# Patient Record
Sex: Male | Born: 1975 | Race: White | Hispanic: No | Marital: Married | Smoking: Never smoker
Health system: Southern US, Community
[De-identification: ages and names within clinical notes are randomized; demographics above are authoritative.]

## PROBLEM LIST (undated history)

## (undated) DIAGNOSIS — M12271 Villonodular synovitis (pigmented), right ankle and foot: Secondary | ICD-10-CM

## (undated) DIAGNOSIS — F909 Attention-deficit hyperactivity disorder, unspecified type: Secondary | ICD-10-CM

## (undated) HISTORY — PX: SHOULDER ARTHROSCOPY: SHX128

## (undated) HISTORY — PX: REVISION AMPUTATION OF FINGER: SHX2346

## (undated) HISTORY — PX: WISDOM TOOTH EXTRACTION: SHX21

## (undated) HISTORY — PX: FINGER TENDON REPAIR: SHX1640

## (undated) HISTORY — PX: TOE SURGERY: SHX1073

## (undated) HISTORY — DX: Attention-deficit hyperactivity disorder, unspecified type: F90.9

---

## 2008-09-03 ENCOUNTER — Emergency Department (HOSPITAL_COMMUNITY): Admission: EM | Admit: 2008-09-03 | Discharge: 2008-09-04 | Payer: Self-pay | Admitting: Emergency Medicine

## 2011-04-28 DIAGNOSIS — D2360 Other benign neoplasm of skin of unspecified upper limb, including shoulder: Secondary | ICD-10-CM | POA: Insufficient documentation

## 2015-11-04 ENCOUNTER — Encounter (HOSPITAL_COMMUNITY): Payer: Self-pay

## 2015-11-04 ENCOUNTER — Emergency Department (HOSPITAL_COMMUNITY): Payer: Managed Care, Other (non HMO)

## 2015-11-04 ENCOUNTER — Emergency Department (HOSPITAL_COMMUNITY)
Admission: EM | Admit: 2015-11-04 | Discharge: 2015-11-04 | Disposition: A | Discharge status: Home / Self Care | Payer: Managed Care, Other (non HMO) | Attending: Emergency Medicine | Admitting: Emergency Medicine

## 2015-11-04 DIAGNOSIS — Z87828 Personal history of other (healed) physical injury and trauma: Secondary | ICD-10-CM | POA: Diagnosis not present

## 2015-11-04 DIAGNOSIS — M5410 Radiculopathy, site unspecified: Secondary | ICD-10-CM | POA: Insufficient documentation

## 2015-11-04 DIAGNOSIS — Z880 Allergy status to penicillin: Secondary | ICD-10-CM | POA: Insufficient documentation

## 2015-11-04 DIAGNOSIS — M792 Neuralgia and neuritis, unspecified: Secondary | ICD-10-CM

## 2015-11-04 DIAGNOSIS — R079 Chest pain, unspecified: Secondary | ICD-10-CM | POA: Diagnosis present

## 2015-11-04 LAB — CBC
HCT: 43 % (ref 39.0–52.0)
Hemoglobin: 15.2 g/dL (ref 13.0–17.0)
MCH: 32.9 pg (ref 26.0–34.0)
MCHC: 35.3 g/dL (ref 30.0–36.0)
MCV: 93.1 fL (ref 78.0–100.0)
PLATELETS: 183 10*3/uL (ref 150–400)
RBC: 4.62 MIL/uL (ref 4.22–5.81)
RDW: 12.4 % (ref 11.5–15.5)
WBC: 4.1 10*3/uL (ref 4.0–10.5)

## 2015-11-04 LAB — BASIC METABOLIC PANEL
ANION GAP: 8 (ref 5–15)
BUN: 12 mg/dL (ref 6–20)
CALCIUM: 9.5 mg/dL (ref 8.9–10.3)
CO2: 28 mmol/L (ref 22–32)
CREATININE: 1.26 mg/dL — AB (ref 0.61–1.24)
Chloride: 102 mmol/L (ref 101–111)
GFR calc Af Amer: >60 mL/min (ref >60)
GFR calc non Af Amer: >60 mL/min (ref >60)
GLUCOSE: 156 mg/dL — AB (ref 65–99)
Potassium: 4.5 mmol/L (ref 3.5–5.1)
Sodium: 138 mmol/L (ref 135–145)

## 2015-11-04 LAB — I-STAT TROPONIN, ED: TROPONIN I, POC: 0 ng/mL (ref 0.00–0.08)

## 2015-11-04 MED ORDER — NAPROXEN 500 MG PO TABS: 500.0000 mg | ORAL_TABLET | Freq: Two times a day (BID) | ORAL | Status: DC

## 2015-11-04 NOTE — ED Notes (Signed)
Pt reports this morning reports developing sudden onset of left shoulder pain that radiated to left chest. Repots it comes and goes. Denies currently. Denies any hx. Reports dizziness, weakness, mild SOB  and mild nausea with the pain.

## 2015-11-04 NOTE — ED Provider Notes (Signed)
CSN: [id number]     Arrival date & time 11/04/15  1359 History   First MD Initiated Contact with Patient 11/04/15 1708     Chief Complaint  Patient presents with  . Chest Pain     (Consider location/radiation/quality/duration/timing/severity/associated sxs/prior Treatment) HPI Comments: Patient reports discomfort actually started in his left anterior shoulder/trapezius area and radiated into his left arm.  He does report prior injury to cervical spine. No previous similar symptoms.  Pain continued for a brief period of time, with patient becoming concerned and then developed transient dizziness, weakness, and shortness of breath--all symptoms have currently resolved.  Patient is a 39 y.o. male presenting with chest pain. The history is provided by the patient. No language interpreter was used.  Chest Pain Pain location:  L chest Pain quality: aching and radiating   Pain radiates to:  L arm Pain radiates to the back: no   Pain severity:  No pain Onset quality:  Sudden Progression:  Resolved Chronicity:  New Relieved by:  Rest Associated symptoms: no fever, no nausea, not vomiting and no weakness     History reviewed. No pertinent past medical history. History reviewed. No pertinent past surgical history. No family history on file. Social History  Substance Use Topics  . Smoking status: Never Smoker   . Smokeless tobacco: None  . Alcohol Use: Yes     Comment: socially     Review of Systems  Constitutional: Negative for fever and chills.  Cardiovascular: Positive for chest pain.  Gastrointestinal: Negative for nausea and vomiting.  Neurological: Negative for weakness.  All other systems reviewed and are negative.     Allergies  Penicillins and Sulfa antibiotics  Home Medications   Prior to Admission medications   Not on File   BP 142/90 mmHg  Pulse 93  Temp(Src) 98.7 F (37.1 C) (Oral)  Resp 18  SpO2 97% Physical Exam  Constitutional: He is oriented to  person, place, and time. He appears well-developed and well-nourished.  HENT:  Didio: Normocephalic.  Eyes: Conjunctivae are normal.  Neck: Neck supple.  Cardiovascular: Normal rate, regular rhythm, normal heart sounds and intact distal pulses.   Pulmonary/Chest: Effort normal and breath sounds normal.  Abdominal: Soft. Bowel sounds are normal.  Musculoskeletal: Normal range of motion. He exhibits no edema or tenderness.  Neurological: He is alert and oriented to person, place, and time. No cranial nerve deficit.  Skin: Skin is warm and dry.  Psychiatric: He has a normal mood and affect.  Nursing note and vitals reviewed.   ED Course  Procedures (including critical care time) Labs Review Labs Reviewed  BASIC METABOLIC PANEL - Abnormal; Notable for the following:    Glucose, Bld 156 (*)    Creatinine, Ser 1.26 (*)    All other components within normal limits  CBC  I-STAT TROPOININ, ED    Imaging Review Dg Chest 2 View  11/04/2015  CLINICAL DATA:  Chest and left shoulder region pain EXAM: CHEST  2 VIEW COMPARISON:  None. FINDINGS: Lungs are clear. Heart size and pulmonary vascularity are normal. No adenopathy. No pneumothorax. No bone lesions. IMPRESSION: No abnormality noted. Electronically Signed   By: Bretta BangWilliam  Woodruff III M.D.   On: 11/04/2015 14:38   I have personally reviewed and evaluated these images and lab results as part of my medical decision-making.   EKG Interpretation   Date/Time:  Tuesday November 04 2015 14:05:52 EST Ventricular Rate:  89 PR Interval:  158 QRS Duration: 92 QT Interval:  350 QTC Calculation: 425 R Axis:   91 Text Interpretation:  Normal sinus rhythm Rightward axis Minimal voltage  criteria for LVH, may be normal variant Nonspecific ST abnormality  Abnormal ECG No old tracing to compare Confirmed by KNAPP  MD-J, JON  ([id number]) on 11/04/2015 5:59:47 PM     Lab and radiology results reviewed and shared with patient. ECG without ischemic  changes. Normal troponin. No early heart disease in family. Low likelihood of cardiac origin of pain. Suspect radicular pain. Care instructions provided. Return precautions discussed. Follow-up with PCP.  MDM   Final diagnoses:  None    Radicular pain.    Felicie Morn, NP 11/04/15 1810  Linwood Dibbles, MD 11/04/15 [phone]

## 2015-11-04 NOTE — Discharge Instructions (Signed)
Radicular Pain °Radicular pain in either the arm or leg is usually from a bulging or herniated disk in the spine. A piece of the herniated disk may press against the nerves as the nerves exit the spine. This causes pain which is felt at the tips of the nerves down the arm or leg. Other causes of radicular pain may include: °· Fractures. °· Heart disease. °· Cancer. °· An abnormal and usually degenerative state of the nervous system or nerves (neuropathy). °Diagnosis may require CT or MRI scanning to determine the primary cause.  °Nerves that start at the neck (nerve roots) may cause radicular pain in the outer shoulder and arm. It can spread down to the thumb and fingers. The symptoms vary depending on which nerve root has been affected. In most cases radicular pain improves with conservative treatment. Neck problems may require physical therapy, a neck collar, or cervical traction. Treatment may take many weeks, and surgery may be considered if the symptoms do not improve.  °Conservative treatment is also recommended for sciatica. Sciatica causes pain to radiate from the lower back or buttock area down the leg into the foot. Often there is a history of back problems. Most patients with sciatica are better after 2 to 4 weeks of rest and other supportive care. Short term bed rest can reduce the disk pressure considerably. Sitting, however, is not a good position since this increases the pressure on the disk. You should avoid bending, lifting, and all other activities which make the problem worse. Traction can be used in severe cases. Surgery is usually reserved for patients who do not improve within the first months of treatment. °Only take over-the-counter or prescription medicines for pain, discomfort, or fever as directed by your caregiver. Narcotics and muscle relaxants may help by relieving more severe pain and spasm and by providing mild sedation. Cold or massage can give significant relief. Spinal manipulation  is not recommended. It can increase the degree of disc protrusion. Epidural steroid injections are often effective treatment for radicular pain. These injections deliver medicine to the spinal nerve in the space between the protective covering of the spinal cord and back bones (vertebrae). Your caregiver can give you more information about steroid injections. These injections are most effective when given within two weeks of the onset of pain.  °You should see your caregiver for follow up care as recommended. A program for neck and back injury rehabilitation with stretching and strengthening exercises is an important part of management.  °SEEK IMMEDIATE MEDICAL CARE IF: °· You develop increased pain, weakness, or numbness in your arm or leg. °· You develop difficulty with bladder or bowel control. °· You develop abdominal pain. °  °This information is not intended to replace advice given to you by your health care provider. Make sure you discuss any questions you have with your health care provider. °  °Document Released: 01/20/2005 Document Revised: 01/03/2015 Document Reviewed: 07/09/2015 °Elsevier Interactive Patient Education ©2016 [employer] ° °

## 2015-11-27 DIAGNOSIS — M12271 Villonodular synovitis (pigmented), right ankle and foot: Secondary | ICD-10-CM

## 2015-11-27 HISTORY — DX: Villonodular synovitis (pigmented), right ankle and foot: M12.271

## 2015-12-01 ENCOUNTER — Other Ambulatory Visit: Payer: Self-pay | Admitting: Physician Assistant

## 2015-12-01 NOTE — H&P (Signed)
Henry Sawyer is a new patient to the office.  This is a healthy 39 year-old.  Presents for evaluation and treatment recommendation for his right ankle.  This has been off and on symptomatic for six years.  Gradual onset.  No specific injury that he can recall.  He has had some sprains in the past.  Anti-inflammatories, physical therapy, alteration of activity and using braces, nothing helps.  He gets sharp anterolateral pinching and catching pain.  A little soreness all the time, but these episodes are getting more dramatic and more constant.  No other joint complaints.   Remaining history is reviewed.  EXAMINATION: General exam is reviewed.  Specifically, healthy appearing 39 year-old.  He has no swelling in any joints.  No neurovascular compromise.  No skin rashes.  Specifically, right ankle is tender anterolateral.  Lacks a little dorsiflexion, but he still has a good 10-15 degrees, but this is painful.  He has good motion.  His ligaments are stable.  Soreness is across the front of he ankle and anterolateral.    X-RAYS: Three view x-ray shows a little bit of anterior bony spurring with bony impingement.  I don't see an obvious osteochondral lesion.    DISPOSITION:  I discussed findings with Henry Sawyer.  I know he has some bony impingement and synovitis, possible medial meniscoid lesion.  The only question is whether or not he has an osteochondral injury.  I went over that with him.  MRI to define pathology.  We have talked about arthroscopy with or without treatment of an osteochondral lesion.  We have talked about immediate weight bearing versus six weeks.  We talked about chondral grafting.  He is going to call me when the scan is complete.  I filled out paperwork to proceed with his arthroscopy and again we will make a final decision about what we are going to do after we see his scan.  He is comfortable with this approach.  More than 45 minutes spent face-to-face covering all of this with him, completing  his scheduling and paperwork for surgery.   Loreta Aveaniel F. Murphy, M.D.  Addendum:  I went over [name]'s MRI report, as well as the scan.  I have spoke with him on the phone.  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  This goes up to, but not over, the shoulder.  There was also evidence of some moderate anterior bony impingement as expected.  In light of the findings plan is exam under anesthesia, arthroscopy, decompression.  I will do a chondroplasty of his osteochondral lesion.  I have talked with him about just doing a microfracturing versus adding Arthrex chondral allograft mixed with stem cells and sealed with a fibrin clot.  Pros and cons of all of these approaches outlined.  Whether I do a graft or just a microfracture, he is going to be non-weight bearing six weeks post-op.  All of this thoroughly discussed and outlined.  I will see him at the time of operative intervention.  Given the fact that this has been present for a number of years I am leaning much more towards grafting this than just doing a microfracture.    Loreta Aveaniel F. Murphy, M.D.

## 2015-12-04 ENCOUNTER — Encounter (HOSPITAL_BASED_OUTPATIENT_CLINIC_OR_DEPARTMENT_OTHER): Payer: Self-pay | Admitting: *Deleted

## 2015-12-11 ENCOUNTER — Encounter (HOSPITAL_BASED_OUTPATIENT_CLINIC_OR_DEPARTMENT_OTHER): Payer: Self-pay | Admitting: *Deleted

## 2015-12-11 ENCOUNTER — Ambulatory Visit (HOSPITAL_BASED_OUTPATIENT_CLINIC_OR_DEPARTMENT_OTHER): Payer: Managed Care, Other (non HMO) | Admitting: Anesthesiology

## 2015-12-11 ENCOUNTER — Ambulatory Visit (HOSPITAL_BASED_OUTPATIENT_CLINIC_OR_DEPARTMENT_OTHER)
Admission: RE | Admit: 2015-12-11 | Discharge: 2015-12-11 | Disposition: A | Discharge status: Home / Self Care | Payer: Managed Care, Other (non HMO) | Source: Ambulatory Visit | Attending: Orthopedic Surgery | Admitting: Orthopedic Surgery

## 2015-12-11 ENCOUNTER — Encounter (HOSPITAL_BASED_OUTPATIENT_CLINIC_OR_DEPARTMENT_OTHER)
Admission: RE | Disposition: A | Discharge status: Home / Self Care | Payer: Self-pay | Source: Ambulatory Visit | Attending: Orthopedic Surgery

## 2015-12-11 DIAGNOSIS — M25871 Other specified joint disorders, right ankle and foot: Secondary | ICD-10-CM | POA: Insufficient documentation

## 2015-12-11 DIAGNOSIS — Z882 Allergy status to sulfonamides status: Secondary | ICD-10-CM | POA: Diagnosis not present

## 2015-12-11 DIAGNOSIS — M24071 Loose body in right ankle: Secondary | ICD-10-CM | POA: Diagnosis not present

## 2015-12-11 DIAGNOSIS — M93271 Osteochondritis dissecans, right ankle and joints of right foot: Secondary | ICD-10-CM | POA: Insufficient documentation

## 2015-12-11 DIAGNOSIS — Z880 Allergy status to penicillin: Secondary | ICD-10-CM | POA: Diagnosis not present

## 2015-12-11 DIAGNOSIS — M12271 Villonodular synovitis (pigmented), right ankle and foot: Secondary | ICD-10-CM | POA: Diagnosis not present

## 2015-12-11 HISTORY — PX: CHONDROPLASTY: SHX5177

## 2015-12-11 HISTORY — PX: ANKLE ARTHROSCOPY WITH DRILLING/MICROFRACTURE: SHX5580

## 2015-12-11 HISTORY — DX: Villonodular synovitis (pigmented), right ankle and foot: M12.271

## 2015-12-11 HISTORY — PX: ANKLE ARTHROSCOPY: SHX545

## 2015-12-11 SURGERY — ARTHROSCOPY, ANKLE
Anesthesia: General | Site: Ankle | Laterality: Right

## 2015-12-11 MED ORDER — SUCCINYLCHOLINE CHLORIDE 20 MG/ML IJ SOLN
INTRAMUSCULAR | Status: AC
  Filled 2015-12-11: qty 1

## 2015-12-11 MED ORDER — DEXAMETHASONE SODIUM PHOSPHATE 10 MG/ML IJ SOLN
INTRAMUSCULAR | Status: AC
  Filled 2015-12-11: qty 1

## 2015-12-11 MED ORDER — ONDANSETRON HCL 4 MG/2ML IJ SOLN
INTRAMUSCULAR | Status: DC | PRN
  Administered 2015-12-11: 4 mg via INTRAVENOUS

## 2015-12-11 MED ORDER — LACTATED RINGERS IV SOLN
INTRAVENOUS | Status: DC
  Administered 2015-12-11: 07:00:00 via INTRAVENOUS

## 2015-12-11 MED ORDER — LIDOCAINE HCL (CARDIAC) 20 MG/ML IV SOLN
INTRAVENOUS | Status: AC
  Filled 2015-12-11: qty 5

## 2015-12-11 MED ORDER — ONDANSETRON HCL 4 MG/2ML IJ SOLN
INTRAMUSCULAR | Status: AC
  Filled 2015-12-11: qty 2

## 2015-12-11 MED ORDER — MIDAZOLAM HCL 2 MG/2ML IJ SOLN
INTRAMUSCULAR | Status: AC
  Filled 2015-12-11: qty 2

## 2015-12-11 MED ORDER — SCOPOLAMINE 1 MG/3DAYS TD PT72: 1.0000 | MEDICATED_PATCH | Freq: Once | TRANSDERMAL | Status: DC

## 2015-12-11 MED ORDER — FENTANYL CITRATE (PF) 100 MCG/2ML IJ SOLN
INTRAMUSCULAR | Status: AC
  Filled 2015-12-11: qty 2

## 2015-12-11 MED ORDER — FENTANYL CITRATE (PF) 100 MCG/2ML IJ SOLN
50.0000 ug | INTRAMUSCULAR | Status: DC | PRN
  Administered 2015-12-11: 50 ug via INTRAVENOUS
  Administered 2015-12-11: 100 ug via INTRAVENOUS

## 2015-12-11 MED ORDER — PROPOFOL 500 MG/50ML IV EMUL
INTRAVENOUS | Status: AC
  Filled 2015-12-11: qty 50

## 2015-12-11 MED ORDER — ONDANSETRON HCL 4 MG PO TABS: 4.0000 mg | ORAL_TABLET | Freq: Three times a day (TID) | ORAL | Status: DC | PRN

## 2015-12-11 MED ORDER — SODIUM CHLORIDE 0.9 % IR SOLN
Status: DC | PRN
  Administered 2015-12-11: 3000 mL

## 2015-12-11 MED ORDER — LIDOCAINE HCL (CARDIAC) 20 MG/ML IV SOLN
INTRAVENOUS | Status: DC | PRN
  Administered 2015-12-11: 40 mg via INTRAVENOUS

## 2015-12-11 MED ORDER — CLINDAMYCIN PHOSPHATE 900 MG/50ML IV SOLN
900.0000 mg | INTRAVENOUS | Status: AC
  Administered 2015-12-11: 900 mg via INTRAVENOUS

## 2015-12-11 MED ORDER — CHLORHEXIDINE GLUCONATE 4 % EX LIQD: 60.0000 mL | Freq: Once | CUTANEOUS | Status: DC

## 2015-12-11 MED ORDER — GLYCOPYRROLATE 0.2 MG/ML IJ SOLN: 0.2000 mg | Freq: Once | INTRAMUSCULAR | Status: DC | PRN

## 2015-12-11 MED ORDER — DEXAMETHASONE SODIUM PHOSPHATE 4 MG/ML IJ SOLN
INTRAMUSCULAR | Status: DC | PRN
  Administered 2015-12-11: 10 mg via INTRAVENOUS

## 2015-12-11 MED ORDER — MIDAZOLAM HCL 2 MG/2ML IJ SOLN
1.0000 mg | INTRAMUSCULAR | Status: DC | PRN
  Administered 2015-12-11: 2 mg via INTRAVENOUS

## 2015-12-11 MED ORDER — CLINDAMYCIN PHOSPHATE 900 MG/50ML IV SOLN
INTRAVENOUS | Status: AC
  Filled 2015-12-11: qty 50

## 2015-12-11 MED ORDER — PROPOFOL 10 MG/ML IV BOLUS
INTRAVENOUS | Status: DC | PRN
  Administered 2015-12-11: 200 mg via INTRAVENOUS

## 2015-12-11 MED ORDER — BUPIVACAINE-EPINEPHRINE (PF) 0.5% -1:200000 IJ SOLN
INTRAMUSCULAR | Status: DC | PRN
  Administered 2015-12-11: 40 mL via PERINEURAL

## 2015-12-11 MED ORDER — LACTATED RINGERS IV SOLN: INTRAVENOUS | Status: DC

## 2015-12-11 MED ORDER — OXYCODONE-ACETAMINOPHEN 5-325 MG PO TABS: 1.0000 | ORAL_TABLET | ORAL | Status: DC | PRN

## 2015-12-11 SURGICAL SUPPLY — 58 items
BANDAGE ELASTIC 4 VELCRO ST LF (GAUZE/BANDAGES/DRESSINGS) ×3 IMPLANT
BANDAGE ELASTIC 6 VELCRO ST LF (GAUZE/BANDAGES/DRESSINGS) ×3 IMPLANT
BANDAGE ESMARK 6X9 LF (GAUZE/BANDAGES/DRESSINGS) ×1 IMPLANT
BLADE 4.2CUDA (BLADE) IMPLANT
BLADE CUDA GRT WHITE 3.5 (BLADE) IMPLANT
BLADE CUDA SHAVER 3.5 (BLADE) IMPLANT
BLADE CUTTER GATOR 3.5 (BLADE) ×3 IMPLANT
BLADE GREAT WHITE 4.2 (BLADE) IMPLANT
BLADE GREAT WHITE 4.2MM (BLADE)
BNDG ESMARK 6X9 LF (GAUZE/BANDAGES/DRESSINGS) ×3
BUR CUDA 2.9 (BURR) ×2 IMPLANT
BUR CUDA 2.9MM (BURR) ×1
BUR FULL RADIUS 2.9 (BURR) ×2 IMPLANT
BUR FULL RADIUS 2.9MM (BURR) ×1
BUR OVAL 4.0 (BURR) ×3 IMPLANT
BUR OVAL 6.0 (BURR) IMPLANT
CUFF TOURNIQUET SINGLE 34IN LL (TOURNIQUET CUFF) ×3 IMPLANT
CUTTER MENISCUS  4.2MM (BLADE)
CUTTER MENISCUS 4.2MM (BLADE) IMPLANT
DECANTER SPIKE VIAL GLASS SM (MISCELLANEOUS) IMPLANT
DRAPE ARTHROSCOPY W/POUCH 90 (DRAPES) ×3 IMPLANT
DRAPE OEC MINIVIEW 54X84 (DRAPES) ×3 IMPLANT
DRAPE SURG 17X23 STRL (DRAPES) ×3 IMPLANT
DURAPREP 26ML APPLICATOR (WOUND CARE) ×3 IMPLANT
ELECT MENISCUS 165MM 90D (ELECTRODE) IMPLANT
ELECT REM PT RETURN 9FT ADLT (ELECTROSURGICAL) ×3
ELECTRODE REM PT RTRN 9FT ADLT (ELECTROSURGICAL) ×1 IMPLANT
GAUZE SPONGE 4X4 12PLY STRL (GAUZE/BANDAGES/DRESSINGS) ×6 IMPLANT
GAUZE XEROFORM 1X8 LF (GAUZE/BANDAGES/DRESSINGS) ×3 IMPLANT
GLOVE BIO SURGEON STRL SZ 6.5 (GLOVE) ×2 IMPLANT
GLOVE BIO SURGEONS STRL SZ 6.5 (GLOVE) ×1
GLOVE BIOGEL PI IND STRL 7.0 (GLOVE) ×2 IMPLANT
GLOVE BIOGEL PI INDICATOR 7.0 (GLOVE) ×4
GLOVE ECLIPSE 7.0 STRL STRAW (GLOVE) ×3 IMPLANT
GLOVE SURG ORTHO 8.0 STRL STRW (GLOVE) ×3 IMPLANT
GOWN STRL REUS W/ TWL LRG LVL3 (GOWN DISPOSABLE) ×2 IMPLANT
GOWN STRL REUS W/ TWL XL LVL3 (GOWN DISPOSABLE) ×1 IMPLANT
GOWN STRL REUS W/TWL LRG LVL3 (GOWN DISPOSABLE) ×4
GOWN STRL REUS W/TWL XL LVL3 (GOWN DISPOSABLE) ×2
IV NS IRRIG 3000ML ARTHROMATIC (IV SOLUTION) ×3 IMPLANT
MANIFOLD NEPTUNE II (INSTRUMENTS) ×3 IMPLANT
NEEDLE KEITH (NEEDLE) IMPLANT
PACK ARTHROSCOPY DSU (CUSTOM PROCEDURE TRAY) ×3 IMPLANT
PACK BASIN DAY SURGERY FS (CUSTOM PROCEDURE TRAY) ×3 IMPLANT
PADDING CAST COTTON 6X4 STRL (CAST SUPPLIES) ×3 IMPLANT
PENCIL BUTTON HOLSTER BLD 10FT (ELECTRODE) IMPLANT
SET ARTHROSCOPY TUBING (MISCELLANEOUS) ×2
SET ARTHROSCOPY TUBING LN (MISCELLANEOUS) ×1 IMPLANT
SLEEVE SCD COMPRESS KNEE MED (MISCELLANEOUS) ×3 IMPLANT
STOCKINETTE 4X48 STRL (DRAPES) ×3 IMPLANT
STRAP ANKLE FOOT DISTRACTOR (ORTHOPEDIC SUPPLIES) ×3 IMPLANT
SUCTION FRAZIER TIP 10 FR DISP (SUCTIONS) ×3 IMPLANT
SUT ETHILON 3 0 PS 1 (SUTURE) ×3 IMPLANT
SUT VIC AB 3-0 SH 27 (SUTURE)
SUT VIC AB 3-0 SH 27X BRD (SUTURE) IMPLANT
TUBE CONNECTING 20'X1/4 (TUBING)
TUBE CONNECTING 20X1/4 (TUBING) IMPLANT
WATER STERILE IRR 1000ML POUR (IV SOLUTION) ×3 IMPLANT

## 2015-12-11 NOTE — Transfer of Care (Signed)
Immediate Anesthesia Transfer of Care Note  [facility]  Procedure(s) Performed: Procedure(s): RIGHT ANKLE ARTHROSCOPY WITH DEBRIDEMENT EXTENSIVE  (Right) CHONDROPLASTY (Right) ANKLE ARTHROSCOPY WITH DRILLING/MICROFRACTURE (Right)  Patient Location: PACU  Anesthesia Type:General  Level of Consciousness: awake and sedated  Airway & Oxygen Therapy: Patient Spontanous Breathing and Patient connected to face mask oxygen  Post-op Assessment: Report given to RN and Post -op Vital signs reviewed and stable  Post vital signs: Reviewed and stable  Last Vitals:  Filed Vitals:   12/11/15 0713 12/11/15 0714  BP:    Pulse: 83 84  Temp:    Resp: 12 16    Complications: No apparent anesthesia complications

## 2015-12-11 NOTE — Discharge Instructions (Signed)
Discharge Instructions after Knee Arthroscopy  NON-WEIGHT BEARING FOR THE NEXT 6 WEEKS  You will have a light dressing on your ankle Leave the dressing in place until the third day after your surgery and then remove it and place a band-aid over the stitches.  After the bandage has been removed you may shower, but do not soak the incision.Marland Kitchen.  Apply ice to the knee 3 times per day for 30 minutes for the first 1 week until your knee is feeling comfortable again. Do not use heat.  Pain medicine has been prescribed for you.  Use your medicine as needed over the first 48 hours, and then you can begin to taper your use. You may take Extra Strength Tylenol or Tylenol only in place of the pain pills.    Please call [phone] for any problems. Including the following:  - excessive redness of the incisions - drainage for more than 4 days - fever of more than 101.5 FRegional Anesthesia Blocks  1. Numbness or the inability to move the "blocked" extremity may last from 3-48 hours after placement. The length of time depends on the medication injected and your individual response to the medication. If the numbness is not going away after 48 hours, call your surgeon.  2. The extremity that is blocked will need to be protected until the numbness is gone and the  Strength has returned. Because you cannot feel it, you will need to take extra care to avoid injury. Because it may be weak, you may have difficulty moving it or using it. You may not know what position it is in without looking at it while the block is in effect.  3. For blocks in the legs and feet, returning to weight bearing and walking needs to be done carefully. You will need to wait until the numbness is entirely gone and the strength has returned. You should be able to move your leg and foot normally before you try and bear weight or walk. You will need someone to be with you when you first try to ensure you do not fall and possibly risk  injury.  4. Bruising and tenderness at the needle site are common side effects and will resolve in a few days.  5. Persistent numbness or new problems with movement should be communicated to the surgeon or the [facility] [phone])/ [facility] [phone]). Post Anesthesia Home Care Instructions  Activity: Get plenty of rest for the remainder of the day. A responsible adult should stay with you for 24 hours following the procedure.  For the next 24 hours, DO NOT: -Drive a car -Advertising copywriterperate machinery -Drink alcoholic beverages -Take any medication unless instructed by your physician -Make any legal decisions or sign important papers.  Meals: Start with liquid foods such as gelatin or soup. Progress to regular foods as tolerated. Avoid greasy, spicy, heavy foods. If nausea and/or vomiting occur, drink only clear liquids until the nausea and/or vomiting subsides. Call your physician if vomiting continues.  Special Instructions/Symptoms: Your throat may feel dry or sore from the anesthesia or the breathing tube placed in your throat during surgery. If this causes discomfort, gargle with warm salt water. The discomfort should disappear within 24 hours.  If you had a scopolamine patch placed behind your ear for the management of post- operative nausea and/or vomiting:  1. The medication in the patch is effective for 72 hours, after which it should be removed.  Wrap patch in a tissue  and discard in the trash. Wash hands thoroughly with soap and water. 2. You may remove the patch earlier than 72 hours if you experience unpleasant side effects which may include dry mouth, dizziness or visual disturbances. 3. Avoid touching the patch. Wash your hands with soap and water after contact with the patch.     *Please note that pain medications will not be refilled after hours or on weekends.

## 2015-12-11 NOTE — Progress Notes (Signed)
Assisted Dr. Turk with right, ultrasound guided, popliteal/saphenous block. Side rails up, monitors on throughout procedure. See vital signs in flow sheet. Tolerated Procedure well. 

## 2015-12-11 NOTE — Anesthesia Procedure Notes (Addendum)
Anesthesia Regional Block:  Adductor canal block  Pre-Anesthetic Checklist: ,, timeout performed, Correct Patient, Correct Site, Correct Laterality, Correct Procedure, Correct Position, site marked, Risks and benefits discussed,  Surgical consent,  Pre-op evaluation,  At surgeon's request and post-op pain management  Laterality: Right  Prep: chloraprep       Needles:  Injection technique: Single-shot  Needle Type: Echogenic Needle     Needle Length: 9cm 9 cm Needle Gauge: 21 and 21 G    Additional Needles:  Procedures: ultrasound guided (picture in chart) Adductor canal block Narrative:  Start time: 12/11/2015 7:05 AM End time: 12/11/2015 7:10 AM Injection made incrementally with aspirations every 5 mL.  Performed by: Personally  Anesthesiologist: Cecile HearingURK, Radhika Dershem EDWARD  Additional Notes: No pain on injection. No increased resistance to injection. Injection made in 5cc increments.  Good needle visualization.  Patient tolerated procedure well.   Anesthesia Regional Block:  Popliteal block  Pre-Anesthetic Checklist: ,, timeout performed, Correct Patient, Correct Site, Correct Laterality, Correct Procedure, Correct Position, site marked, Risks and benefits discussed,  Surgical consent,  Pre-op evaluation,  At surgeon's request and post-op pain management  Laterality: Right  Prep: chloraprep       Needles:  Injection technique: Single-shot  Needle Type: Echogenic Needle     Needle Length: 9cm 9 cm Needle Gauge: 21 and 21 G    Additional Needles:  Procedures: ultrasound guided (picture in chart) Popliteal block Narrative:  Injection made incrementally with aspirations every 5 mL.  Performed by: Personally  Anesthesiologist: Cecile HearingURK, Harel Repetto EDWARD  Additional Notes: No pain on injection. No increased resistance to injection. Injection made in 5cc increments.  Good needle visualization.  Patient tolerated procedure well.

## 2015-12-11 NOTE — Interval H&P Note (Signed)
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 After consideration of risks, benefits and other options for treatment, the patient has consented to  Procedure(s): RIGHT ANKLE ARTHROSCOPY WITH DEBRIDEMENT EXTENSIVE  (Right) as a surgical intervention .  The patient's history has been reviewed, patient examined, no change in status, stable for surgery.  I have reviewed the patient's chart and labs.  Questions were answered to the patient's satisfaction.     Loreta Aveaniel F Buffi Ewton

## 2015-12-11 NOTE — Anesthesia Postprocedure Evaluation (Signed)
Anesthesia Post Note  Patient: Henry Sawyer  Procedure(s) Performed: Procedure(s) (LRB): RIGHT ANKLE ARTHROSCOPY WITH DEBRIDEMENT EXTENSIVE  (Right) CHONDROPLASTY (Right) ANKLE ARTHROSCOPY WITH DRILLING/MICROFRACTURE (Right)  Patient location during evaluation: PACU Anesthesia Type: Regional and General Level of consciousness: awake and alert Pain management: pain level controlled Vital Signs Assessment: post-procedure vital signs reviewed and stable Respiratory status: spontaneous breathing Cardiovascular status: blood pressure returned to baseline Anesthetic complications: no    Last Vitals:  Filed Vitals:   12/11/15 0915 12/11/15 1010  BP: 137/90 132/87  Pulse: 85 84  Temp:  37 C  Resp: 14 16    Last Pain:  Filed Vitals:   12/11/15 1015  PainSc: 0-No pain                 Kennieth RadFitzgerald, Sybol Morre E

## 2015-12-11 NOTE — Anesthesia Preprocedure Evaluation (Addendum)
Anesthesia Evaluation  Patient identified by MRN, date of birth, ID band Patient awake    Reviewed: Allergy & Precautions, NPO status , Patient's Chart, lab work & pertinent test results  Airway Mallampati: II  TM Distance: >3 FB Neck ROM: Full    Dental  (+) Teeth Intact, Dental Advisory Given   Pulmonary neg pulmonary ROS,    Pulmonary exam normal breath sounds clear to auscultation       Cardiovascular Exercise Tolerance: Good negative cardio ROS Normal cardiovascular exam Rhythm:Regular Rate:Normal     Neuro/Psych negative neurological ROS  negative psych ROS   GI/Hepatic negative GI ROS, Neg liver ROS,   Endo/Other  negative endocrine ROS  Renal/GU negative Renal ROS     Musculoskeletal VILLONODULAR SYNOVITIS    Abdominal   Peds  Hematology negative hematology ROS (+)   Anesthesia Other Findings Day of surgery medications reviewed with the patient.  Reproductive/Obstetrics                            Anesthesia Physical Anesthesia Plan  ASA: I  Anesthesia Plan: General   Post-op Pain Management: GA combined w/ Regional for post-op pain   Induction: Intravenous  Airway Management Planned: LMA  Additional Equipment:   Intra-op Plan:   Post-operative Plan: Extubation in OR  Informed Consent: I have reviewed the patients History and Physical, chart, labs and discussed the procedure including the risks, benefits and alternatives for the proposed anesthesia with the patient or authorized representative who has indicated his/her understanding and acceptance.   Dental advisory given  Plan Discussed with: CRNA  Anesthesia Plan Comments: (Risks/benefits of general anesthesia discussed with patient including risk of damage to teeth, lips, gum, and tongue, nausea/vomiting, allergic reactions to medications, and the possibility of heart attack, stroke and death.  All patient questions  answered.  Patient wishes to proceed.  GA + Popliteal/Adductor canal block)      Anesthesia Quick Evaluation

## 2015-12-12 ENCOUNTER — Encounter (HOSPITAL_BASED_OUTPATIENT_CLINIC_OR_DEPARTMENT_OTHER): Payer: Self-pay | Admitting: Orthopedic Surgery

## 2015-12-12 NOTE — Op Note (Signed)
NAME:  Henry Sawyer, Henry Sawyer                ACCOUNT NO.:  [id number]  MEDICAL RECORD NO.:  [id number]  LOCATION:                               FACILITY:  [facility]  PHYSICIAN:  Loreta Aveaniel F. Murphy, M.D. DATE OF BIRTH:  01-19-1976  DATE OF PROCEDURE:  12/11/2015 DATE OF DISCHARGE:  12/11/2015                              OPERATIVE REPORT   PREOPERATIVE DIAGNOSES:  Right ankle area of osteochondritis dissecans. Osteochondral fracture of medial talar dome, right ankle.  Reactive synovitis and spurring.  Anterior bony impingement.  POSTOPERATIVE DIAGNOSES:  Right ankle area of osteochondritis dissecans. Osteochondral fracture of medial talar dome, right ankle.  Reactive synovitis and spurring.  Anterior bony impingement with a lesion being on the medial border of the top of the talus going just to the edge.  A 10-mm front to back, 6-mm wide with loose bodies.  PROCEDURES:  Right ankle exam under anesthesia, arthroscopy.  Removal of loose bodies from osteochondral lesion.  Treatment with microfracturing. Partial synovectomy.  Anterior decompression and removal of the tibial spurs.  SURGEON:  Loreta Aveaniel F. Murphy, M.D.  ASSISTANT:  Mikey KirschnerLindsey Stanberry, PA, present throughout the entire case and necessary for timely completion of procedure.  ANESTHESIA:  General.  BLOOD LOSS:  Minimal.  SPECIMENS:  None.  CULTURES:  None.  COMPLICATIONS:  None.  DRESSINGS:  Soft compressive.  TOURNIQUET TIME:  45 minutes.  DESCRIPTION OF PROCEDURE:  The patient was brought to the operating room and after adequate anesthesia had been obtained, tourniquet applied on the thigh.  Prepped and draped in usual sterile fashion.  Exsanguinated with elevation of Esmarch.  Tourniquet was inflated to 350 mmHg.  Two portals; one each medial and lateral anteriorly on the ankle avoiding neurovascular structures.  Arthroscope was introduced.  Knee was distended and inspected.  Hypertrophic synovitis debrided.   Articular cartilage looked good except of the medial talar dome where there was an unstable osteochondral lesion with fragmentation.  All loose bodies were removed to a stable surface.  Went right up to the edge, but not over the top of the talus.  Because of its size, I elected to treat this with microfracture, which was done through the medial portal.  Fluid and pressure reduced and confirmed bleeding.  I then did a complete anterior decompression and removing the anterior tibial spurs obliterating all anterior bony impingement, both confirmed fluoroscopically and arthroscopically.  Entire ankle was examined.  No other findings were appreciated.  Instruments and fluids were removed.  Portals were closed with nylon.  Sterile compressive dressing applied.  Tourniquet was deflated and removed.  Anesthesia reversed.  Brought to the recovery room.  Tolerated the surgery well.  No complications.     Loreta Aveaniel F. Murphy, M.D.     DFM/MEDQ  D:  12/11/2015  T:  12/12/2015  Job:  [id number]

## 2017-09-11 IMAGING — DX DG CHEST 2V
2 series · 2 of 2 positions shown · non-contrast
Comparison: None.

CLINICAL DATA: Chest and left shoulder region pain

EXAM:
CHEST  2 VIEW

[chest pa]
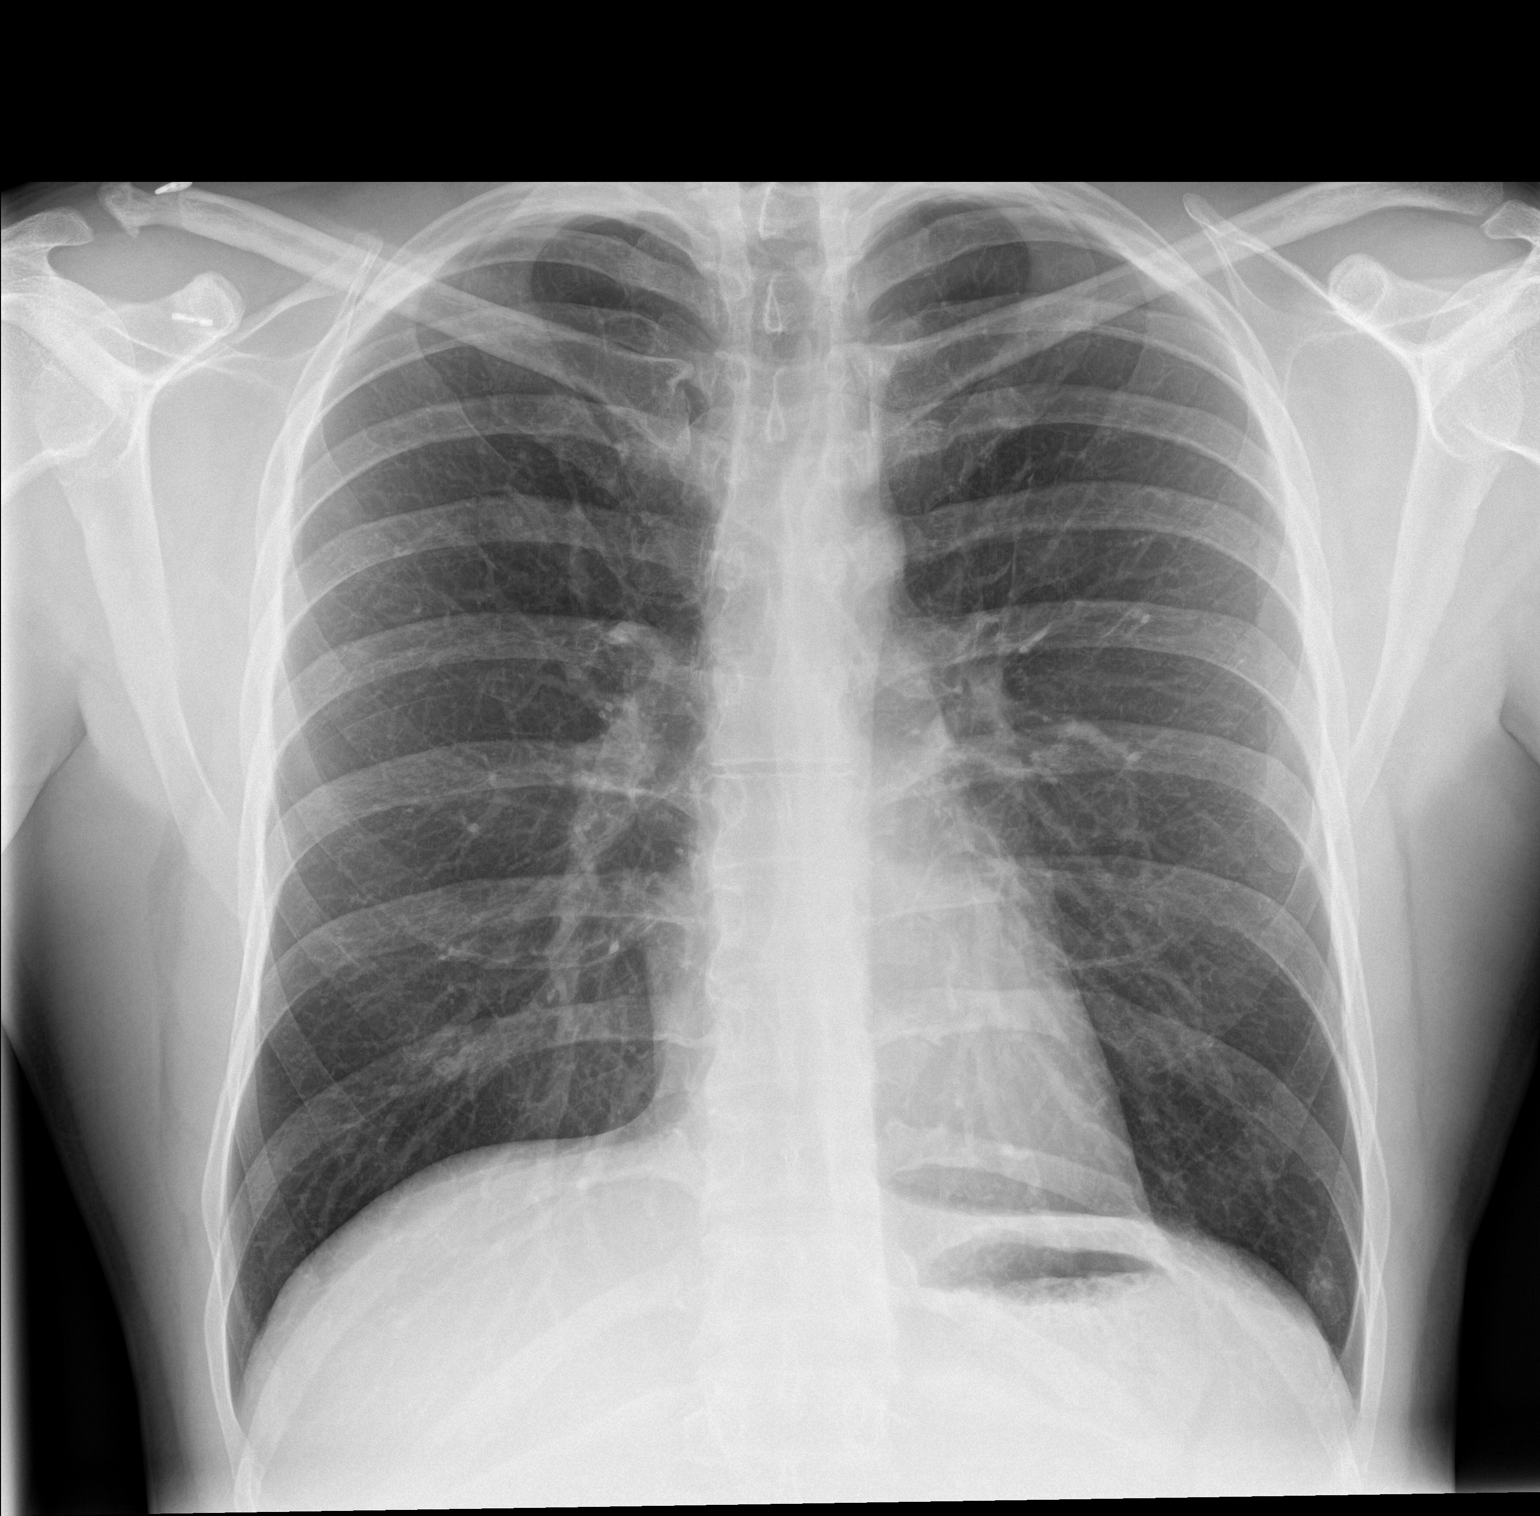

[chest lat]
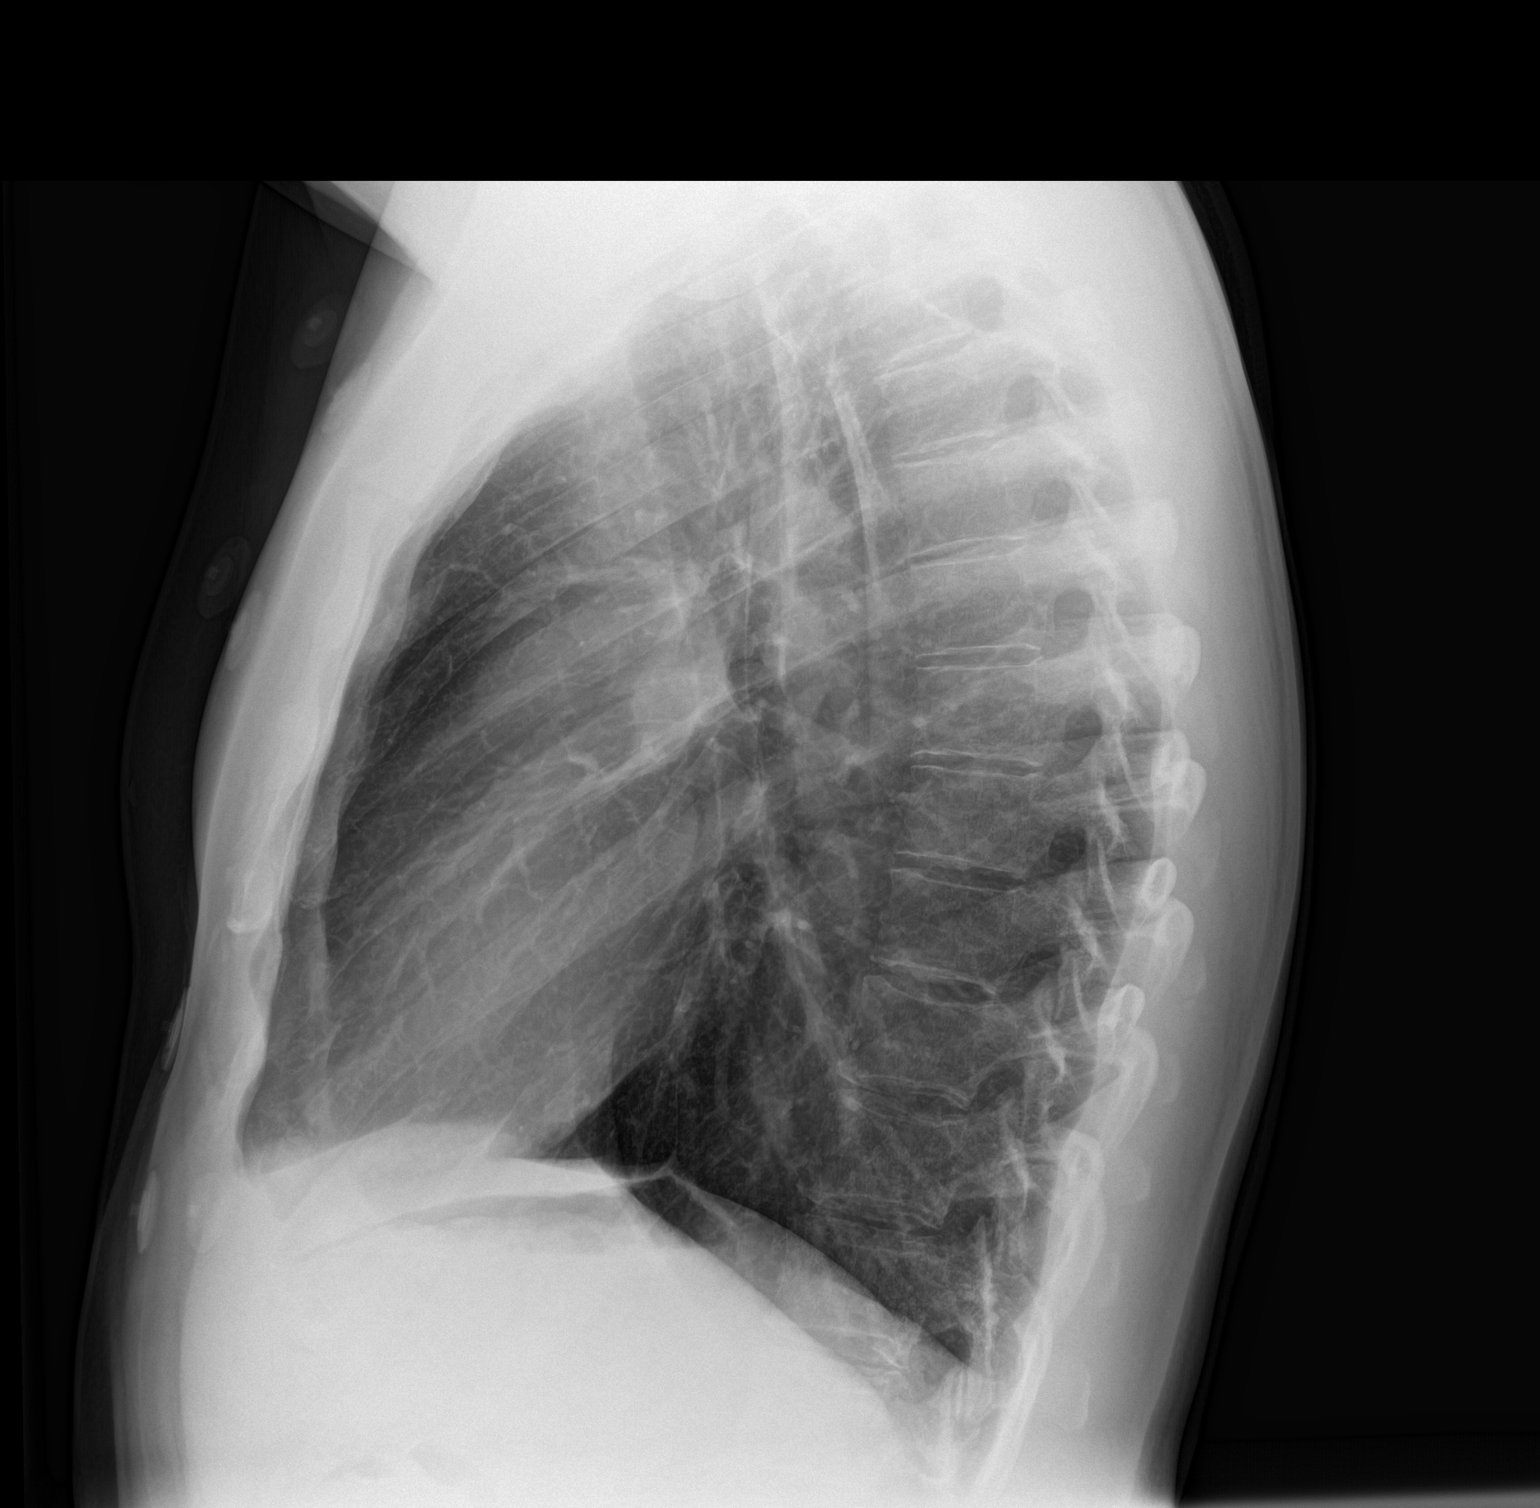

[2 of 2 positions shown; findings below may reference images not displayed]

FINDINGS: Lungs are clear. Heart size and pulmonary vascularity are normal. No
adenopathy. No pneumothorax. No bone lesions.
IMPRESSION: No abnormality noted.

## 2023-11-21 ENCOUNTER — Other Ambulatory Visit: Payer: Self-pay | Admitting: Orthopedic Surgery

## 2023-11-21 ENCOUNTER — Ambulatory Visit: Payer: Managed Care, Other (non HMO)

## 2023-11-21 DIAGNOSIS — M958 Other specified acquired deformities of musculoskeletal system: Secondary | ICD-10-CM

## 2023-11-21 DIAGNOSIS — M19071 Primary osteoarthritis, right ankle and foot: Secondary | ICD-10-CM

## 2023-11-30 DIAGNOSIS — M958 Other specified acquired deformities of musculoskeletal system: Secondary | ICD-10-CM | POA: Insufficient documentation

## 2023-12-14 HISTORY — PX: ANKLE SURGERY: SHX546

## 2024-08-28 ENCOUNTER — Ambulatory Visit (INDEPENDENT_AMBULATORY_CARE_PROVIDER_SITE_OTHER): Admitting: Student in an Organized Health Care Education/Training Program

## 2024-08-28 ENCOUNTER — Encounter: Payer: Self-pay | Admitting: Student in an Organized Health Care Education/Training Program

## 2024-08-28 VITALS — BP 134/77 | HR 59 | Ht 71.2 in | Wt 177.0 lb

## 2024-08-28 DIAGNOSIS — Z13220 Encounter for screening for lipoid disorders: Secondary | ICD-10-CM

## 2024-08-28 DIAGNOSIS — R030 Elevated blood-pressure reading, without diagnosis of hypertension: Secondary | ICD-10-CM

## 2024-08-28 DIAGNOSIS — Z1159 Encounter for screening for other viral diseases: Secondary | ICD-10-CM

## 2024-08-28 DIAGNOSIS — Z114 Encounter for screening for human immunodeficiency virus [HIV]: Secondary | ICD-10-CM

## 2024-08-28 DIAGNOSIS — Z131 Encounter for screening for diabetes mellitus: Secondary | ICD-10-CM | POA: Diagnosis not present

## 2024-08-28 DIAGNOSIS — Z0000 Encounter for general adult medical examination without abnormal findings: Secondary | ICD-10-CM | POA: Diagnosis not present

## 2024-08-28 DIAGNOSIS — Z1211 Encounter for screening for malignant neoplasm of colon: Secondary | ICD-10-CM

## 2024-08-28 NOTE — Progress Notes (Signed)
 Complete physical exam  Patient: Henry Sawyer   DOB: April 25, 1976   48 y.o. Male  MRN: [id number]  Subjective:    Chief Complaint  Patient presents with   Establish Care    Referral for colonoscopy and would like a physical as well     Henry Sawyer is a 48 y.o. male who presents today for a complete physical exam. He reports consuming a general diet.  He generally feels well. He reports sleeping well. He does not have additional problems to discuss today.   Discussed the use of AI scribe software for clinical note transcription with the patient, who gave verbal consent to proceed.  History of Present Illness Henry Sawyer is a 48 year old male who presents for a routine check-up and management of orthopedic issues.  He has a history of right ankle issues, including an osteochondral defect of the talus, which required two surgeries, the first in 2017-2018 and the second in December of the previous year. The second surgery revealed significant cartilage delamination, leaving only about 60% of the cartilage intact. The ankle was doing well until about a month ago when it started to become inflamed again, coinciding with a return to regular activities. This has impacted his ability to run, bike, and hike, which were significant parts of his life. He is currently trying to ease back into these activities.  He has a history of multiple orthopedic injuries, including a grade five shoulder separation, torn thumb tendons and ligaments, and a reattached finger. He also had a significant calcium deposit removed from his big toe due to repeated fractures.  He does not take any regular medications but uses multivitamins, D3K2, fish oil, magnesium, and occasional electrolyte drinks. He stopped drinking alcohol about seven weeks ago, which has improved his sleep.  His family history includes a sister with melanoma and a father with Alzheimer's disease. His mother has atrial fibrillation. No significant  personal history of hospital admissions or other surgeries beyond his orthopedic issues.  No current medications aside from multivitamins and supplements. Reports improved sleep after stopping alcohol consumption. No issues with vision or hearing, though notes recent decline in close-up vision. No lumps or skin concerns noted. No urinary symptoms reported.   Most recent fall risk assessment:    08/28/2024    1:49 PM  Fall Risk   Falls in the past year? 0  Number falls in past yr: 0  Injury with Fall? 0  Risk for fall due to : No Fall Risks  Follow up Falls evaluation completed     Most recent depression screenings:    08/28/2024    1:49 PM  PHQ 2/9 Scores  PHQ - 2 Score 0  PHQ- 9 Score 3    Patient Care Team: Jerrell Henry Ned, MD as PCP - General (Internal Medicine)   Outpatient Medications Prior to Visit  Medication Sig   [DISCONTINUED] aspirin 81 MG chewable tablet Chew 81 mg by mouth daily.   [DISCONTINUED] Multiple Vitamin (MULTIVITAMIN WITH MINERALS) TABS tablet Take 1 tablet by mouth daily. (Patient not taking: Reported on 08/28/2024)   [DISCONTINUED] ondansetron  (ZOFRAN ) 4 MG tablet Take 1 tablet (4 mg total) by mouth every 8 (eight) hours as needed for nausea or vomiting. (Patient not taking: Reported on 08/28/2024)   [DISCONTINUED] oxyCODONE -acetaminophen  (ROXICET) 5-325 MG tablet Take 1-2 tablets by mouth every 4 (four) hours as needed. (Patient not taking: Reported on 08/28/2024)   No facility-administered medications prior to visit.  Objective:     BP 134/77   Pulse (!) 59   Ht 5' 11.2 (1.808 m)   Wt 177 lb (80.3 kg)   SpO2 100%   BMI 24.55 kg/m   Physical Exam   Gen: Well-appearing young man Eyes: Normal Ears: Normal tympanic membranes bilaterally Neck: Normal thyroid, no adenopathy Heart: Regular, no murmur Lungs: Unlabored, clear throughout Abd: Soft, nontender, no hernias, no organomegaly Ext: Warm, no edema, normal joints Neuro: Alert,  conversational, full strength upper and lower extremities, normal gait and balance Psych: Appropriate mood and affect, not anxious or depressed appearing      Assessment & Plan:    Routine Health Maintenance and Physical Exam  Immunization History  Administered Date(s) Administered   Tdap 05/15/2012    Health Maintenance  Topic Date Due   HIV Screening  Never done   Hepatitis C Screening  Never done   Hepatitis B Vaccines 19-59 Average Risk (1 of 3 - 19+ 3-dose series) Never done   Colonoscopy  Never done   DTaP/Tdap/Td (2 - Td or Tdap) 05/15/2022   INFLUENZA VACCINE  Never done   COVID-19 Vaccine (1 - 2024-25 season) Never done   Pneumococcal Vaccine  Aged Out   HPV VACCINES  Aged Out   Meningococcal B Vaccine  Aged Out    Discussed health benefits of physical activity, and encouraged him to engage in regular exercise appropriate for his age and condition.  Problem List Items Addressed This Visit       Medium    Prehypertension (Chronic)   Blood pressure today mildly elevated at 134/77.  Seems consistent with prehypertension.  Would recommend repeat blood pressure monitoring every year.  Will check a BMP today.  Would not recommend blood pressure lowering medications right now.      Relevant Orders   Basic metabolic panel with GFR     Low   Health maintenance examination - Primary (Chronic)   Very healthy young man.  Normal weights.  No issues with mood or substance use.  We talked about colon cancer screening, decided to refer for colonoscopy.  Continue with healthy nutrition, healthy exercise with reasonable expectations.  Follow-up in 1 year.  Check lipids and A1c today.      Other Visit Diagnoses       Encounter for HCV screening test for low risk patient       Relevant Orders   Hepatitis C antibody     Screening for HIV (human immunodeficiency virus)       Relevant Orders   HIV Antibody (routine testing w rflx)     Screening for diabetes mellitus        Relevant Orders   Hemoglobin A1c     Screening for lipid disorders       Relevant Orders   Lipid panel     Screening for colon cancer       Relevant Orders   Ambulatory referral to Gastroenterology      Return in about 1 year (around 08/28/2025).     Henry Sawyer Specking, MD

## 2024-08-28 NOTE — Assessment & Plan Note (Signed)
 Very healthy young man.  Normal weights.  No issues with mood or substance use.  We talked about colon cancer screening, decided to refer for colonoscopy.  Continue with healthy nutrition, healthy exercise with reasonable expectations.  Follow-up in 1 year.  Check lipids and A1c today.

## 2024-08-28 NOTE — Patient Instructions (Signed)
  VISIT SUMMARY: Today, you came in for a routine check-up and to discuss your ongoing orthopedic issues. We reviewed your history of right ankle problems, including your surgeries and recent inflammation, as well as your other past injuries. We also talked about your recent vision changes and overall health.  YOUR PLAN: -OSTEOCHONDRAL DEFECT OF RIGHT TALUS, POST-RECONSTRUCTION: This is a chronic condition where the cartilage in your ankle is damaged, causing inflammation and limiting your activities. You should gradually return to your activities without overexerting yourself. We also discussed the possibility of a future ankle replacement, and you should consult with an orthopedic specialist if needed.  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 You should see an optometrist for an evaluation and to discuss potential corrective lenses.  INSTRUCTIONS: Please follow up with an orthopedic specialist to discuss the possibility of future ankle replacement if your symptoms persist. Additionally, schedule an appointment with an optometrist for a vision evaluation and potential corrective lenses.

## 2024-08-28 NOTE — Assessment & Plan Note (Signed)
 Blood pressure today mildly elevated at 134/77.  Seems consistent with prehypertension.  Would recommend repeat blood pressure monitoring every year.  Will check a BMP today.  Would not recommend blood pressure lowering medications right now.

## 2024-08-29 LAB — HIV ANTIBODY (ROUTINE TESTING W REFLEX): HIV 1&2 Ab, 4th Generation: NONREACTIVE

## 2024-08-29 LAB — BASIC METABOLIC PANEL WITH GFR
BUN: 14 mg/dL (ref 6–23)
CO2: 28 meq/L (ref 19–32)
Calcium: 9.1 mg/dL (ref 8.4–10.5)
Chloride: 104 meq/L (ref 96–112)
Creatinine, Ser: 0.95 mg/dL (ref 0.40–1.50)
GFR: 94.84 mL/min (ref >60.00)
Glucose, Bld: 101 mg/dL — ABNORMAL HIGH (ref 70–99)
Potassium: 4.4 meq/L (ref 3.5–5.1)
Sodium: 139 meq/L (ref 135–145)

## 2024-08-29 LAB — HEPATITIS C ANTIBODY: Hepatitis C Ab: NONREACTIVE

## 2024-08-29 LAB — LIPID PANEL
Cholesterol: 200 mg/dL (ref 0–200)
HDL: 47.3 mg/dL (ref >39.00)
LDL Cholesterol: 134 mg/dL — ABNORMAL HIGH (ref 0–99)
NonHDL: 153.15
Total CHOL/HDL Ratio: 4
Triglycerides: 96 mg/dL (ref 0.0–149.0)
VLDL: 19.2 mg/dL (ref 0.0–40.0)

## 2024-08-29 LAB — HEMOGLOBIN A1C: Hgb A1c MFr Bld: 5.9 % (ref 4.6–6.5)

## 2024-08-30 ENCOUNTER — Ambulatory Visit: Payer: Self-pay | Admitting: Student in an Organized Health Care Education/Training Program

## 2024-11-27 ENCOUNTER — Encounter: Payer: Self-pay | Admitting: Gastroenterology

## 2024-12-14 ENCOUNTER — Ambulatory Visit

## 2024-12-14 VITALS — Ht 71.0 in | Wt 165.0 lb

## 2024-12-14 DIAGNOSIS — Z1211 Encounter for screening for malignant neoplasm of colon: Secondary | ICD-10-CM

## 2024-12-14 MED ORDER — NA SULFATE-K SULFATE-MG SULF 17.5-3.13-1.6 GM/177ML PO SOLN: 1.0000 | Freq: Once | ORAL | 0 refills | Status: AC

## 2024-12-14 NOTE — Progress Notes (Signed)
 No egg or soy allergy known to patient  No issues known to pt with past sedation with any surgeries or procedures Patient denies ever being told they had issues or difficulty with intubation  No FH of Malignant Hyperthermia Pt is not on diet pills Pt is not on  home 02  Pt is not on blood thinners  Pt denies issues with constipation  No A fib or A flutter Have any cardiac testing pending--NO Pt can ambulate- Independently Pt denies use of chewing tobacco Discussed diabetic I weight loss medication holds Discussed NSAID holds Checked BMI Pt instructed to use Singlecare.com or GoodRx for a price reduction on prep  Patient's chart reviewed by Cathlyn Parsons CNRA prior to previsit and patient appropriate for the [facility].  Pre visit completed and red dot placed by patient's name on their procedure day (on provider's schedule).

## 2025-01-01 ENCOUNTER — Encounter: Payer: Self-pay | Admitting: Gastroenterology

## 2025-01-04 ENCOUNTER — Ambulatory Visit: Admitting: Gastroenterology

## 2025-01-04 ENCOUNTER — Encounter: Payer: Self-pay | Admitting: Gastroenterology

## 2025-01-04 VITALS — BP 108/65 | HR 54 | Temp 97.9°F | Resp 20 | Ht 71.0 in | Wt 165.0 lb

## 2025-01-04 DIAGNOSIS — K640 First degree hemorrhoids: Secondary | ICD-10-CM | POA: Diagnosis not present

## 2025-01-04 DIAGNOSIS — Z1211 Encounter for screening for malignant neoplasm of colon: Secondary | ICD-10-CM

## 2025-01-04 MED ORDER — SODIUM CHLORIDE 0.9 % IV SOLN: 500.0000 mL | INTRAVENOUS | Status: DC

## 2025-01-04 NOTE — Progress Notes (Signed)
 "   GASTROENTEROLOGY PROCEDURE H&P NOTE   Primary Care Physician: Jerrell Cleatus Ned, MD    Reason for Procedure:  Colon Cancer screening  Plan:    Colonoscopy  Patient is appropriate for endoscopic procedure(s) in the ambulatory ([facility]) setting.  The nature of the procedure, as well as the risks, benefits, and alternatives were carefully and thoroughly reviewed with the patient. Ample time for discussion and questions allowed. The patient understood, was satisfied, and agreed to proceed. I personally addressed all patient questions and concerns.     HPI: Henry Sawyer is a 49 y.o. male who presents for colonoscopy for routine Colon Cancer screening.  No active GI symptoms.  No known family history of colon cancer or related malignancy.  Patient is otherwise without complaints or active issues today.  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    Past Surgical History:  Procedure Laterality Date   ANKLE ARTHROSCOPY Right 12/11/2015   Procedure: RIGHT ANKLE ARTHROSCOPY WITH DEBRIDEMENT EXTENSIVE ;  Surgeon: Toribio JULIANNA Chancy, MD;  Location: [facility];  Service: Orthopedics;  Laterality: Right;   ANKLE ARTHROSCOPY WITH DRILLING/MICROFRACTURE Right 12/11/2015   Procedure: ANKLE ARTHROSCOPY WITH DRILLING/MICROFRACTURE;  Surgeon: Toribio JULIANNA Chancy, MD;  Location: [facility];  Service: Orthopedics;  Laterality: Right;   ANKLE SURGERY Right 12/14/2023   CHONDROPLASTY Right 12/11/2015   Procedure: CHONDROPLASTY;  Surgeon: Toribio JULIANNA Chancy, MD;  Location: [facility];  Service: Orthopedics;  Laterality: Right;   FINGER TENDON REPAIR Left    thumb   REVISION AMPUTATION OF FINGER Right    ring finger   SHOULDER ARTHROSCOPY Right    TOE SURGERY Right    exc. calcium deposit great toe   WISDOM TOOTH EXTRACTION      Prior to Admission medications  Not on File    No  current outpatient medications on file.   Current Facility-Administered Medications  Medication Dose Route Frequency Provider Last Rate Last Admin   0.9 %  sodium chloride  infusion  500 mL Intravenous Continuous Sherolyn Trettin V, DO        Allergies as of 01/04/2025 - Review Complete 01/04/2025  Allergen Reaction Noted   Penicillins Hives 11/04/2015   Sulfa antibiotics Hives 11/04/2015    Family History  Problem Relation Age of Onset   Atrial fibrillation Mother    Alzheimer's disease Father    Melanoma Sister    Alzheimer's disease Paternal Grandfather    Colon cancer Neg Hx    Esophageal cancer Neg Hx    Stomach cancer Neg Hx    Rectal cancer Neg Hx     Social History   Socioeconomic History   Marital status: Married    Spouse name: Not on file   Number of children: Not on file   Years of education: Not on file   Highest education level: Not on file  Occupational History   Not on file  Tobacco Use   Smoking status: Never   Smokeless tobacco: Never  Vaping Use   Vaping status: Never Used  Substance and Sexual Activity   Alcohol use: Yes    Comment: occasionally   Drug use: No   Sexual activity: Not on file  Other Topics Concern   Not on file  Social History Narrative   Not on file   Social Drivers of Health   Tobacco Use: Low Risk (01/04/2025)   Patient History  Smoking Tobacco Use: Never    Smokeless Tobacco Use: Never    Passive Exposure: Not on file  Financial Resource Strain: Not on file  Food Insecurity: Not on file  Transportation Needs: Not on file  Physical Activity: Not on file  Stress: Not on file  Social Connections: Not on file  Intimate Partner Violence: Not on file  Depression (PHQ2-9): Low Risk (08/28/2024)   Depression (PHQ2-9)    PHQ-2 Score: 3  Alcohol Screen: Not on file  Housing: Not on file  Utilities: Not on file  Health Literacy: Not on file    Physical Exam: Vital signs in last 24 hours: @BP  127/81   Pulse 63   Temp  97.9 F (36.6 C)   Resp 16   Ht 5' 11 (1.803 m)   Wt 165 lb (74.8 kg)   SpO2 96%   BMI 23.01 kg/m  GEN: NAD EYE: Sclerae anicteric ENT: MMM CV: Non-tachycardic Pulm: CTA b/l GI: Soft, NT/ND NEURO:  Alert & Oriented x 3   Sandor Flatter, DO [facility]   01/04/2025 11:22 AM  "

## 2025-01-04 NOTE — Progress Notes (Signed)
 Report given to PACU, vss

## 2025-01-04 NOTE — Progress Notes (Signed)
 Pt's states no medical or surgical changes since previsit or office visit.

## 2025-01-04 NOTE — Patient Instructions (Signed)
" ° °  Return to normal activities tomorrow.  Resume previous diet.  Continue on present medications.  Repeat colonoscopy in 10 years for screening purposes.  Return to GI clinic PRN.  YOU HAD AN ENDOSCOPIC PROCEDURE TODAY AT THE [facility]:   Refer to the procedure report that was given to you for any specific questions about what was found during the examination.  If the procedure report does not answer your questions, please call your gastroenterologist to clarify.  If you requested that your care partner not be given the details of your procedure findings, then the procedure report has been included in a sealed envelope for you to review at your convenience later.  YOU SHOULD EXPECT: Some feelings of bloating in the abdomen. Passage of more gas than usual.  Walking can help get rid of the air that was put into your GI tract during the procedure and reduce the bloating. If you had a lower endoscopy (such as a colonoscopy or flexible sigmoidoscopy) you may notice spotting of blood in your stool or on the toilet paper. If you underwent a bowel prep for your procedure, you may not have a normal bowel movement for a few days.  Please Note:  You might notice some irritation and congestion in your nose or some drainage.  This is from the oxygen used during your procedure.  There is no need for concern and it should clear up in a day or so.  SYMPTOMS TO REPORT IMMEDIATELY:  Following lower endoscopy (colonoscopy or flexible sigmoidoscopy):  Excessive amounts of blood in the stool  Significant tenderness or worsening of abdominal pains  Swelling of the abdomen that is new, acute  Fever of 100F or higher   For urgent or emergent issues, a gastroenterologist can be reached at any hour by calling [phone]. Do not use MyChart messaging for urgent concerns.    DIET:  We do recommend a small meal at first, but then you may proceed to your regular diet.  Drink plenty of fluids but  you should avoid alcoholic beverages for 24 hours.  ACTIVITY:  You should plan to take it easy for the rest of today and you should NOT DRIVE or use heavy machinery until tomorrow (because of the sedation medicines used during the test).    FOLLOW UP: Our staff will call the number listed on your records the next business day following your procedure.  We will call around 7:15- 8:00 am to check on you and address any questions or concerns that you may have regarding the information given to you following your procedure. If we do not reach you, we will leave a message.     If any biopsies were taken you will be contacted by phone or by letter within the next 1-3 weeks.  Please call us  at [phone] if you have not heard about the biopsies in 3 weeks.    SIGNATURES/CONFIDENTIALITY: You and/or your care partner have signed paperwork which will be entered into your electronic medical record.  These signatures attest to the fact that that the information above on your After Visit Summary has been reviewed and is understood.  Full responsibility of the confidentiality of this discharge information lies with you and/or your care-partner. "

## 2025-01-04 NOTE — Op Note (Signed)
 [facility] Patient Name: Henry Sawyer Procedure Date: 01/04/2025 11:10 AM MRN: [id number] Endoscopist: Sandor Flatter , MD, [id number] Age: 49 Referring MD:  Date of Birth: 1976/09/18 Gender: Male Account #: [id number] Procedure:                Colonoscopy Indications:              Screening for colorectal malignant neoplasm, This                            is the patient's first colonoscopy Medicines:                Monitored Anesthesia Care Procedure:                Pre-Anesthesia Assessment:                           - Prior to the procedure, a History and Physical                            was performed, and patient medications and                            allergies were reviewed. The patient's tolerance of                            previous anesthesia was also reviewed. The risks                            and benefits of the procedure and the sedation                            options and risks were discussed with the patient.                            All questions were answered, and informed consent                            was obtained. Prior Anticoagulants: The patient has                            taken no anticoagulant or antiplatelet agents. ASA                            Grade Assessment: I - A normal, healthy patient.                            After reviewing the risks and benefits, the patient                            was deemed in satisfactory condition to undergo the                            procedure.  After obtaining informed consent, the colonoscope                            was passed under direct vision. Throughout the                            procedure, the patient's blood pressure, pulse, and                            oxygen saturations were monitored continuously. The                            Colonoscope was introduced through the anus and                            advanced to the the cecum, identified  by                            appendiceal orifice and ileocecal valve. The                            colonoscopy was performed without difficulty. The                            patient tolerated the procedure well. The quality                            of the bowel preparation was good. The ileocecal                            valve, appendiceal orifice, and rectum were                            photographed. Scope In: 11:35:00 AM Scope Out: 11:51:50 AM Scope Withdrawal Time: 0 hours 14 minutes 17 seconds  Total Procedure Duration: 0 hours 16 minutes 50 seconds  Findings:                 The perianal and digital rectal examinations were                            normal.                           The entire colon appeared normal.                           Non-bleeding internal hemorrhoids were found during                            retroflexion. The hemorrhoids were small and Grade                            I (internal hemorrhoids that do not prolapse). Complications:            No immediate complications. Estimated Blood  Loss:     Estimated blood loss: none. Impression:               - The entire examined colon is normal.                           - Non-bleeding internal hemorrhoids.                           - No specimens collected. Recommendation:           - Patient has a contact number available for                            emergencies. The signs and symptoms of potential                            delayed complications were discussed with the                            patient. Return to normal activities tomorrow.                            Written discharge instructions were provided to the                            patient.                           - Resume previous diet.                           - Continue present medications.                           - Repeat colonoscopy in 10 years for screening                            purposes.                           -  Return to GI clinic PRN. Sandor Flatter, MD 01/04/2025 11:58:24 AM

## 2025-01-07 ENCOUNTER — Telehealth: Payer: Self-pay | Admitting: *Deleted

## 2025-01-07 NOTE — Telephone Encounter (Signed)
" °  Follow up Call-     01/04/2025   10:35 AM  Call back number  Post procedure Call Back phone  # [phone]  Permission to leave phone message Yes    Post procedure follow up phone call. No answer at number given.  Left message on voicemail.  "

## 2025-01-18 ENCOUNTER — Ambulatory Visit: Payer: Self-pay | Admitting: Behavioral Health

## 2025-01-18 ENCOUNTER — Encounter: Payer: Self-pay | Admitting: Behavioral Health

## 2025-01-18 VITALS — BP 130/85 | HR 88 | Ht 72.0 in | Wt 171.0 lb

## 2025-01-18 DIAGNOSIS — F411 Generalized anxiety disorder: Secondary | ICD-10-CM

## 2025-01-18 MED ORDER — AMPHETAMINE-DEXTROAMPHETAMINE 10 MG PO TABS: 10.0000 mg | ORAL_TABLET | Freq: Every day | ORAL | 0 refills | Status: AC

## 2025-01-18 NOTE — Progress Notes (Signed)
 [facility] MD/PA/NP Initial Note  01/18/2025 1:31 PM Tobin Witucki  MRN:  [id number]  Chief Complaint:  Chief Complaint   ADHD; Follow-up; Patient Education; Anxiety     HPI:  Henry Sawyer, 49 year old male presents to this office for initial visit and to establish care.  He is accompanied by his wife Heron with his verbal consent.  Collateral information should be considered reliable.  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  Says that ADHD was suspected as a child but very strict parents prevented any official diagnosis or medication.  Said that over the years that he is overcompensated for his attention and focus problems with his ability to retain information.  However, he has problems staying on task, trying to stay engaged on projects where he lacks interest.  He also reports misplacing things frequently at home, he is easily distracted by noise, and often finishes sentences of people before they can finish themselves.  Patient denies any significant current depression but says that when he is struggling with projects it may cause some mild anxiety.  He was initially diagnosed with ADHD and started on Adderall during his early to mid 30s.  Said that he stopped the medication approximately after 6 months.  He does not like having to rely on medication.  However, he feels like the medication was very successful in helping him stay on track and stay focused.  Says that he is at the point where he would like to restart Adderall and see if it has positive effects.  Reports normal sleep at 7 to 8 hours per night.  Appetite is good.  Patient is very active.  Strong family support system from spouse who is a warden/ranger.  Denies history of mania, no psychosis, no prior hospitalizations or psychiatric concerns, no auditory or visual hallucinations.  No history of substance abuse.  No history of SI or HI.  Past psychiatric medication trials: Adderall   Visit Diagnosis:    ICD-10-CM    1. 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    Past Surgical History:  Procedure Laterality Date   ANKLE ARTHROSCOPY Right 12/11/2015   Procedure: RIGHT ANKLE ARTHROSCOPY WITH DEBRIDEMENT EXTENSIVE ;  Surgeon: Toribio JULIANNA Chancy, MD;  Location: [facility];  Service: Orthopedics;  Laterality: Right;   ANKLE ARTHROSCOPY WITH DRILLING/MICROFRACTURE Right 12/11/2015   Procedure: ANKLE ARTHROSCOPY WITH DRILLING/MICROFRACTURE;  Surgeon: Toribio JULIANNA Chancy, MD;  Location: [facility];  Service: Orthopedics;  Laterality: Right;   ANKLE SURGERY Right 12/14/2023   CHONDROPLASTY Right 12/11/2015   Procedure: CHONDROPLASTY;  Surgeon: Toribio JULIANNA Chancy, MD;  Location: [facility];  Service: Orthopedics;  Laterality: Right;   FINGER TENDON REPAIR Left    thumb   REVISION AMPUTATION OF FINGER Right    ring finger   SHOULDER ARTHROSCOPY Right    TOE SURGERY Right    exc. calcium deposit great toe   WISDOM TOOTH EXTRACTION      Family Psychiatric History: none noted this visit  Family History:  Family History  Problem Relation Age of Onset   Atrial fibrillation Mother    Alzheimer's disease Father    Melanoma Sister    Alzheimer's disease Paternal Grandfather    Colon cancer Neg Hx    Esophageal  cancer Neg Hx    Stomach cancer Neg Hx    Rectal cancer Neg Hx     Social History:  Social History   Socioeconomic History   Marital status: Married    Spouse name: Heron   Number of children: 2   Years of education: Not on file   Highest education level: Not on file  Occupational History   Not on file  Tobacco Use   Smoking status: Never   Smokeless tobacco: Never  Vaping Use   Vaping status: Never Used  Substance and  Sexual Activity   Alcohol use: Yes    Comment: occasionally   Drug use: No   Sexual activity: Yes  Other Topics Concern   Not on file  Social History Narrative   Lives in [place] with spouse Heron and two children ages 62 and 6.  Enjoys   Outdoor activities such as skiing, diving, and hunting.     Social Drivers of Health   Tobacco Use: Low Risk (01/18/2025)   Patient History    Smoking Tobacco Use: Never    Smokeless Tobacco Use: Never    Passive Exposure: Not on file  Financial Resource Strain: Not on file  Food Insecurity: Not on file  Transportation Needs: Not on file  Physical Activity: Not on file  Stress: Not on file  Social Connections: Not on file  Depression (PHQ2-9): Low Risk (08/28/2024)   Depression (PHQ2-9)    PHQ-2 Score: 3  Alcohol Screen: Not on file  Housing: Not on file  Utilities: Not on file  Health Literacy: Not on file    Allergies: Allergies[1]  Metabolic Disorder Labs: Lab Results  Component Value Date   HGBA1C 5.9 08/28/2024   No results found for: PROLACTIN Lab Results  Component Value Date   CHOL 200 08/28/2024   TRIG 96.0 08/28/2024   HDL 47.30 08/28/2024   CHOLHDL 4 08/28/2024   VLDL 19.2 08/28/2024   LDLCALC 134 (H) 08/28/2024   No results found for: TSH  Therapeutic Level Labs: No results found for: LITHIUM No results found for: VALPROATE No results found for: CBMZ  Current Medications: Current Outpatient Medications  Medication Sig Dispense Refill   amphetamine-dextroamphetamine (ADDERALL) 10 MG tablet Take 1 tablet (10 mg total) by mouth daily with breakfast. 30 tablet 0   No current facility-administered medications for this visit.    Medication Side Effects: none  Orders placed this visit:  No orders of the defined types were placed in this encounter.   Psychiatric Specialty Exam:  Review of Systems  Constitutional: Negative.   Allergic/Immunologic: Negative.   Psychiatric/Behavioral:   Positive for decreased concentration.     Blood pressure 130/85, pulse 88, height 6' (1.829 m), weight 171 lb (77.6 kg).Body mass index is 23.19 kg/m.  General Appearance: Casual, Neat, and Well Groomed  Eye Contact:  Good  Speech:  Clear and Coherent  Volume:  Normal  Mood:  NA  Affect:  Appropriate  Thought Process:  Coherent  Orientation:  Full (Time, Place, and Person)  Thought Content: Logical   Suicidal Thoughts:  No  Homicidal Thoughts:  No  Memory:  WNL  Judgement:  Good  Insight:  Good  Psychomotor Activity:  Normal  Concentration:  Concentration: Good  Recall:  Good  Fund of Knowledge: Good  Language: Good  Assets:  Desire for Improvement  ADL's:  Intact  Cognition: WNL  Prognosis:  Good   Screenings:  GAD-7    Flowsheet Row Office Visit  from 08/28/2024 in [facility]  Total GAD-7 Score 3   PHQ2-9    Flowsheet Row Office Visit from 08/28/2024 in [facility] Total Score 0  PHQ-9 Total Score 3    Receiving Psychotherapy: No   Treatment Plan/Recommendations:   Greater than 50% of  60 min face to face time with patient was spent on counseling and coordination of care. We discussed his concerns about lack of attention and focus.  Discussed his prior history of ADHD during his early to mid 30s.  Reviewed prior plan of care along with past medication trials.  I have no concerns of potential for abuse.  Patient is very intelligent and cooperative.  Spouse is a psychologist who provides constructive feedback.  We agreed today to: Will start Adderall 10 mg IR daily in the morning after breakfast Will follow-up in 4 weeks to reassess Will report side effects or worsening symptoms promptly Provided emergency contact information Discussed potential benefits, risks, and side effects of stimulants with patient to include increased heart rate, palpitations, insomnia, increased anxiety,  increased irritability, or decreased appetite.  Instructed patient to contact office if experiencing any significant tolerability issues.  Reviewed PDMP   Redell DELENA Pizza, NP               [1]  Allergies Allergen Reactions   Penicillins Hives   Sulfa Antibiotics Hives

## 2025-02-15 ENCOUNTER — Telehealth: Admitting: Behavioral Health

## 2025-08-29 ENCOUNTER — Encounter: Admitting: Student in an Organized Health Care Education/Training Program
